# Patient Record
Sex: Female | Born: 1975 | Race: White | Hispanic: No | Marital: Married | Smoking: Never smoker
Health system: Southern US, Community
[De-identification: ages and names within clinical notes are randomized; demographics above are authoritative.]

## PROBLEM LIST (undated history)

## (undated) DIAGNOSIS — N970 Female infertility associated with anovulation: Secondary | ICD-10-CM

## (undated) DIAGNOSIS — N841 Polyp of cervix uteri: Secondary | ICD-10-CM

## (undated) DIAGNOSIS — A609 Anogenital herpesviral infection, unspecified: Secondary | ICD-10-CM

## (undated) HISTORY — DX: Polyp of cervix uteri: N84.1

## (undated) HISTORY — DX: Female infertility associated with anovulation: N97.0

## (undated) HISTORY — DX: Anogenital herpesviral infection, unspecified: A60.9

---

## 2012-07-01 ENCOUNTER — Encounter: Payer: Self-pay | Admitting: Family Medicine

## 2012-07-01 ENCOUNTER — Ambulatory Visit (INDEPENDENT_AMBULATORY_CARE_PROVIDER_SITE_OTHER): Payer: BC Managed Care – PPO | Admitting: Family Medicine

## 2012-07-01 VITALS — BP 125/79 | HR 78 | Temp 97.1°F | Resp 16 | Ht 66.5 in | Wt 197.0 lb

## 2012-07-01 DIAGNOSIS — A6000 Herpesviral infection of urogenital system, unspecified: Secondary | ICD-10-CM

## 2012-07-01 DIAGNOSIS — A6009 Herpesviral infection of other urogenital tract: Secondary | ICD-10-CM | POA: Insufficient documentation

## 2012-07-01 MED ORDER — VALACYCLOVIR HCL 1 G PO TABS: 1000.0000 mg | ORAL_TABLET | Freq: Every day | ORAL | Status: DC

## 2012-07-01 NOTE — Progress Notes (Signed)
Subjective:    Patient ID: Tanya Alvarez, female    DOB: [birth year], 37 y.o.   MRN: [id number] Chief Complaint  Patient presents with  . consultation    may need refill on valtrex   HPI  Tanya Alvarez is a delightful 37 yo woman who is here to establish care. She and her husband moved here sev mos ago from [place], [place]. She and her husband have been in a monogamous relationship for many years. However, last June she developed some painful genital ulcerations and was diagnosed with Type 1 and 2 HSV by + clx but negative titers so she knew it was a new infection. Her husband never really had symptoms but he was tested an did have positive titers so he had had HSV for a long time.  Husband was put on supressive therapy but she was just put on abortive therapy. She has now had 4 breakouts in past 7 mos so would like to start on suppressive therapy if I agree that it is medically indicated. Was seeing a fertility specialist in [place] before she moved as her and her husband have been unsuccessfully trying to conceive for yrs.  She did have an appt with some repro edno clinic ([facility] in [place] but was very unimpressed - did not seem like they had the lastest techniques and would be VERY expensive - the dr.s in [place] had found ways to make it more financially feasible. For now she is going to make an appt w/ a different repro endo dr - [facility]? - but if that doesn't work she will consider going to one of the teaching hospitals vs traveling back to [place] for her care.  No past medical history on file. No current outpatient prescriptions on file prior to visit.   Not on File No past surgical history on file. Family History  Problem Relation Age of Onset  . Cancer Maternal Grandmother     lung  . Cancer Maternal Grandfather     lung  . Cancer Paternal Grandmother     prostate   History  Substance Use Topics  . Smoking status: Never Smoker   . Smokeless tobacco:  Not on file  . Alcohol Use: No     Review of Systems  Constitutional: Negative for fever, chills and unexpected weight change.  Genitourinary: Positive for genital sores and menstrual problem. Negative for dysuria, hematuria, flank pain, vaginal discharge, pelvic pain and dyspareunia.  Psychiatric/Behavioral: Negative for dysphoric mood. The patient is not nervous/anxious.       BP 125/79  Pulse 78  Temp 97.1 F (36.2 C) (Oral)  Resp 16  Ht 5' 6.5" (1.689 m)  Wt 197 lb (89.359 kg)  BMI 31.32 kg/m2  LMP 06/23/2012 Objective:   Physical Exam  Constitutional: She is oriented to person, place, and time. She appears well-developed and well-nourished. No distress.  HENT:  Head: Normocephalic and atraumatic.  Right Ear: External ear normal.  Left Ear: External ear normal.  Eyes: Conjunctivae normal are normal. No scleral icterus.  Neck: Normal range of motion. Neck supple. No thyromegaly present.  Cardiovascular: Normal rate, regular rhythm, normal heart sounds and intact distal pulses.   Pulmonary/Chest: Effort normal and breath sounds normal. No respiratory distress.  Musculoskeletal: She exhibits no edema.  Lymphadenopathy:    She has no cervical adenopathy.  Neurological: She is alert and oriented to person, place, and time.  Skin: Skin is warm and dry. She is not diaphoretic. No erythema.  Psychiatric: She has a normal mood and affect. Her behavior is normal.          Assessment & Plan:   1. Genital herpes in women  valACYclovir (VALTREX) 1000 MG tablet  Start daily suppressive therapy - likely ok to use in pregnancy but may want to stop during the firs trimester. She will discuss w/ her future gynecologist when the time comes. Cont PNV. Meds ordered this encounter  Medications         . valACYclovir (VALTREX) 1000 MG tablet    Sig: Take 1 tablet (1,000 mg total) by mouth daily.    Dispense:  90 tablet    Refill:  3

## 2012-07-04 ENCOUNTER — Encounter: Payer: Self-pay | Admitting: *Deleted

## 2013-06-08 NOTE — L&D Delivery Note (Signed)
Delivery Note At 6:39 PM a healthy female was delivered via Vaginal, Spontaneous Delivery (Presentation: ant occiput  ).  1 loose [place].  APGAR: 8, 9; weight .   Placenta status: Intact, Spontaneous.  Cord: 3 vessels with the following complications: None.  Cord pH: Not done  Anesthesia: Epidural  Episiotomy: None Lacerations: 1st degree;Labial Suture Repair: 3.0 vicryl rapide Est. Blood Loss (mL): 400  Mom to postpartum.  Baby to Couplet care / Skin to Skin.  Evyn Kooyman,MARIE-LYNE 02/01/2014, 7:17 PM

## 2013-06-23 LAB — OB RESULTS CONSOLE HEPATITIS B SURFACE ANTIGEN: Hepatitis B Surface Ag: NEGATIVE

## 2013-06-23 LAB — OB RESULTS CONSOLE GC/CHLAMYDIA
CHLAMYDIA, DNA PROBE: NEGATIVE
GC PROBE AMP, GENITAL: NEGATIVE

## 2013-06-23 LAB — OB RESULTS CONSOLE HIV ANTIBODY (ROUTINE TESTING): HIV: NONREACTIVE

## 2013-06-23 LAB — OB RESULTS CONSOLE ABO/RH: RH Type: POSITIVE

## 2013-06-23 LAB — OB RESULTS CONSOLE RPR: RPR: NONREACTIVE

## 2013-06-23 LAB — OB RESULTS CONSOLE ANTIBODY SCREEN: ANTIBODY SCREEN: NEGATIVE

## 2013-06-23 LAB — OB RESULTS CONSOLE RUBELLA ANTIBODY, IGM: Rubella: IMMUNE

## 2013-08-29 ENCOUNTER — Ambulatory Visit (HOSPITAL_COMMUNITY)
Admission: RE | Admit: 2013-08-29 | Discharge: 2013-08-29 | Disposition: A | Discharge status: Home / Self Care | Payer: BC Managed Care – PPO | Source: Ambulatory Visit | Attending: Certified Nurse Midwife | Admitting: Certified Nurse Midwife

## 2013-08-29 ENCOUNTER — Other Ambulatory Visit (HOSPITAL_COMMUNITY): Payer: Self-pay | Admitting: Obstetrics & Gynecology

## 2013-08-29 DIAGNOSIS — M79609 Pain in unspecified limb: Secondary | ICD-10-CM | POA: Insufficient documentation

## 2013-08-29 DIAGNOSIS — M25569 Pain in unspecified knee: Secondary | ICD-10-CM

## 2013-08-29 DIAGNOSIS — M7989 Other specified soft tissue disorders: Secondary | ICD-10-CM | POA: Insufficient documentation

## 2013-08-29 DIAGNOSIS — M25469 Effusion, unspecified knee: Secondary | ICD-10-CM

## 2013-08-29 NOTE — Progress Notes (Signed)
VASCULAR LAB PRELIMINARY  PRELIMINARY  PRELIMINARY  PRELIMINARY  Right lower extremity venous duplex completed.    Preliminary report:  Right:  No evidence of DVT, superficial thrombosis, or Baker's cyst.  Keierra Nudo, RVT 08/29/2013, 6:00 PM

## 2014-01-12 LAB — OB RESULTS CONSOLE GBS: GBS: NEGATIVE

## 2014-01-29 ENCOUNTER — Encounter (HOSPITAL_COMMUNITY): Payer: Self-pay | Admitting: *Deleted

## 2014-01-29 ENCOUNTER — Telehealth (HOSPITAL_COMMUNITY): Payer: Self-pay | Admitting: *Deleted

## 2014-01-29 NOTE — Telephone Encounter (Signed)
Preadmission screen  

## 2014-01-30 ENCOUNTER — Other Ambulatory Visit: Payer: Self-pay | Admitting: Obstetrics & Gynecology

## 2014-02-01 ENCOUNTER — Inpatient Hospital Stay (HOSPITAL_COMMUNITY): Payer: BC Managed Care – PPO | Admitting: Anesthesiology

## 2014-02-01 ENCOUNTER — Inpatient Hospital Stay (HOSPITAL_COMMUNITY)
Admission: RE | Admit: 2014-02-01 | Discharge: 2014-02-01 | Disposition: A | Discharge status: Home / Self Care | Payer: BC Managed Care – PPO | Source: Ambulatory Visit | Attending: Obstetrics & Gynecology | Admitting: Obstetrics & Gynecology

## 2014-02-01 ENCOUNTER — Encounter (HOSPITAL_COMMUNITY): Payer: Self-pay | Admitting: *Deleted

## 2014-02-01 ENCOUNTER — Encounter (HOSPITAL_COMMUNITY): Payer: BC Managed Care – PPO | Admitting: Anesthesiology

## 2014-02-01 ENCOUNTER — Inpatient Hospital Stay (HOSPITAL_COMMUNITY)
Admission: AD | Admit: 2014-02-01 | Discharge: 2014-02-03 | DRG: 774 | Disposition: A | Discharge status: Home / Self Care | Payer: BC Managed Care – PPO | Source: Ambulatory Visit | Attending: Obstetrics & Gynecology | Admitting: Obstetrics & Gynecology

## 2014-02-01 DIAGNOSIS — O409XX0 Polyhydramnios, unspecified trimester, not applicable or unspecified: Principal | ICD-10-CM | POA: Diagnosis present

## 2014-02-01 DIAGNOSIS — D62 Acute posthemorrhagic anemia: Secondary | ICD-10-CM | POA: Diagnosis not present

## 2014-02-01 DIAGNOSIS — A6000 Herpesviral infection of urogenital system, unspecified: Secondary | ICD-10-CM | POA: Diagnosis present

## 2014-02-01 DIAGNOSIS — O98519 Other viral diseases complicating pregnancy, unspecified trimester: Secondary | ICD-10-CM | POA: Diagnosis present

## 2014-02-01 DIAGNOSIS — O480 Post-term pregnancy: Secondary | ICD-10-CM | POA: Diagnosis present

## 2014-02-01 DIAGNOSIS — O9903 Anemia complicating the puerperium: Secondary | ICD-10-CM | POA: Diagnosis not present

## 2014-02-01 DIAGNOSIS — Z6841 Body Mass Index (BMI) 40.0 and over, adult: Secondary | ICD-10-CM

## 2014-02-01 DIAGNOSIS — E669 Obesity, unspecified: Secondary | ICD-10-CM | POA: Diagnosis present

## 2014-02-01 DIAGNOSIS — O99214 Obesity complicating childbirth: Secondary | ICD-10-CM

## 2014-02-01 LAB — CBC
HCT: 36.7 % (ref 36.0–46.0)
HEMOGLOBIN: 12.7 g/dL (ref 12.0–15.0)
MCH: 31.8 pg (ref 26.0–34.0)
MCHC: 34.6 g/dL (ref 30.0–36.0)
MCV: 91.8 fL (ref 78.0–100.0)
PLATELETS: 207 10*3/uL (ref 150–400)
RBC: 4 MIL/uL (ref 3.87–5.11)
RDW: 13.1 % (ref 11.5–15.5)
WBC: 10.8 10*3/uL — ABNORMAL HIGH (ref 4.0–10.5)

## 2014-02-01 LAB — RPR

## 2014-02-01 LAB — ABO/RH: ABO/RH(D): A POS

## 2014-02-01 LAB — TYPE AND SCREEN
ABO/RH(D): A POS
Antibody Screen: NEGATIVE

## 2014-02-01 MED ORDER — OXYTOCIN 40 UNITS IN LACTATED RINGERS INFUSION - SIMPLE MED: 62.5000 mL/h | INTRAVENOUS | Status: DC | PRN

## 2014-02-01 MED ORDER — ONDANSETRON HCL 4 MG/2ML IJ SOLN: 4.0000 mg | Freq: Four times a day (QID) | INTRAMUSCULAR | Status: DC | PRN

## 2014-02-01 MED ORDER — OXYCODONE-ACETAMINOPHEN 5-325 MG PO TABS: 1.0000 | ORAL_TABLET | ORAL | Status: DC | PRN

## 2014-02-01 MED ORDER — ACETAMINOPHEN 325 MG PO TABS: 650.0000 mg | ORAL_TABLET | ORAL | Status: DC | PRN

## 2014-02-01 MED ORDER — EPHEDRINE 5 MG/ML INJ
10.0000 mg | INTRAVENOUS | Status: DC | PRN
  Filled 2014-02-01: qty 2

## 2014-02-01 MED ORDER — WITCH HAZEL-GLYCERIN EX PADS: 1.0000 "application " | MEDICATED_PAD | CUTANEOUS | Status: DC | PRN

## 2014-02-01 MED ORDER — FLEET ENEMA 7-19 GM/118ML RE ENEM
1.0000 | ENEMA | RECTAL | Status: DC | PRN
Start: 2014-02-01 — End: 2014-02-01

## 2014-02-01 MED ORDER — LACTATED RINGERS IV SOLN
INTRAVENOUS | Status: DC
  Administered 2014-02-01 (×3): via INTRAVENOUS

## 2014-02-01 MED ORDER — CITRIC ACID-SODIUM CITRATE 334-500 MG/5ML PO SOLN
30.0000 mL | ORAL | Status: DC | PRN
Start: 2014-02-01 — End: 2014-02-01

## 2014-02-01 MED ORDER — OXYTOCIN BOLUS FROM INFUSION: 500.0000 mL | INTRAVENOUS | Status: DC

## 2014-02-01 MED ORDER — TETANUS-DIPHTH-ACELL PERTUSSIS 5-2.5-18.5 LF-MCG/0.5 IM SUSP: 0.5000 mL | Freq: Once | INTRAMUSCULAR | Status: DC

## 2014-02-01 MED ORDER — FENTANYL 2.5 MCG/ML BUPIVACAINE 1/10 % EPIDURAL INFUSION (WH - ANES)
14.0000 mL/h | INTRAMUSCULAR | Status: DC | PRN
  Administered 2014-02-01: 14 mL/h via EPIDURAL
  Filled 2014-02-01: qty 125

## 2014-02-01 MED ORDER — PRENATAL MULTIVITAMIN CH
1.0000 | ORAL_TABLET | Freq: Every day | ORAL | Status: DC
  Administered 2014-02-02 – 2014-02-03 (×2): 1 via ORAL
  Filled 2014-02-01 (×2): qty 1

## 2014-02-01 MED ORDER — BUPIVACAINE HCL (PF) 0.25 % IJ SOLN
INTRAMUSCULAR | Status: DC | PRN
  Administered 2014-02-01 (×2): 5 mL via EPIDURAL

## 2014-02-01 MED ORDER — DIPHENHYDRAMINE HCL 50 MG/ML IJ SOLN: 12.5000 mg | INTRAMUSCULAR | Status: DC | PRN

## 2014-02-01 MED ORDER — LACTATED RINGERS IV SOLN: 500.0000 mL | Freq: Once | INTRAVENOUS | Status: DC

## 2014-02-01 MED ORDER — ONDANSETRON HCL 4 MG PO TABS: 4.0000 mg | ORAL_TABLET | ORAL | Status: DC | PRN

## 2014-02-01 MED ORDER — OXYTOCIN 40 UNITS IN LACTATED RINGERS INFUSION - SIMPLE MED: 62.5000 mL/h | INTRAVENOUS | Status: DC

## 2014-02-01 MED ORDER — ZOLPIDEM TARTRATE 5 MG PO TABS: 5.0000 mg | ORAL_TABLET | Freq: Every evening | ORAL | Status: DC | PRN

## 2014-02-01 MED ORDER — PHENYLEPHRINE 40 MCG/ML (10ML) SYRINGE FOR IV PUSH (FOR BLOOD PRESSURE SUPPORT)
80.0000 ug | PREFILLED_SYRINGE | INTRAVENOUS | Status: DC | PRN
  Filled 2014-02-01: qty 2

## 2014-02-01 MED ORDER — DIPHENHYDRAMINE HCL 25 MG PO CAPS: 25.0000 mg | ORAL_CAPSULE | Freq: Four times a day (QID) | ORAL | Status: DC | PRN

## 2014-02-01 MED ORDER — DIBUCAINE 1 % RE OINT: 1.0000 "application " | TOPICAL_OINTMENT | RECTAL | Status: DC | PRN

## 2014-02-01 MED ORDER — OXYTOCIN 40 UNITS IN LACTATED RINGERS INFUSION - SIMPLE MED
1.0000 m[IU]/min | INTRAVENOUS | Status: DC
  Administered 2014-02-01: 2 m[IU]/min via INTRAVENOUS
  Filled 2014-02-01: qty 1000

## 2014-02-01 MED ORDER — LACTATED RINGERS IV SOLN: 500.0000 mL | INTRAVENOUS | Status: DC | PRN

## 2014-02-01 MED ORDER — IBUPROFEN 600 MG PO TABS: 600.0000 mg | ORAL_TABLET | Freq: Four times a day (QID) | ORAL | Status: DC | PRN

## 2014-02-01 MED ORDER — BENZOCAINE-MENTHOL 20-0.5 % EX AERO
1.0000 "application " | INHALATION_SPRAY | CUTANEOUS | Status: DC | PRN
  Administered 2014-02-01: 1 via TOPICAL
  Filled 2014-02-01 (×2): qty 56

## 2014-02-01 MED ORDER — LIDOCAINE HCL (PF) 1 % IJ SOLN
INTRAMUSCULAR | Status: DC | PRN
  Administered 2014-02-01 (×7): 4 mL

## 2014-02-01 MED ORDER — IBUPROFEN 600 MG PO TABS
600.0000 mg | ORAL_TABLET | Freq: Four times a day (QID) | ORAL | Status: DC
  Administered 2014-02-01 – 2014-02-03 (×7): 600 mg via ORAL
  Filled 2014-02-01 (×7): qty 1

## 2014-02-01 MED ORDER — SIMETHICONE 80 MG PO CHEW: 80.0000 mg | CHEWABLE_TABLET | ORAL | Status: DC | PRN

## 2014-02-01 MED ORDER — LIDOCAINE HCL (PF) 1 % IJ SOLN
30.0000 mL | INTRAMUSCULAR | Status: DC | PRN
  Filled 2014-02-01: qty 30

## 2014-02-01 MED ORDER — ONDANSETRON HCL 4 MG/2ML IJ SOLN: 4.0000 mg | INTRAMUSCULAR | Status: DC | PRN

## 2014-02-01 MED ORDER — SENNOSIDES-DOCUSATE SODIUM 8.6-50 MG PO TABS
2.0000 | ORAL_TABLET | ORAL | Status: DC
  Administered 2014-02-02: 2 via ORAL
  Filled 2014-02-01: qty 2

## 2014-02-01 MED ORDER — LANOLIN HYDROUS EX OINT: TOPICAL_OINTMENT | CUTANEOUS | Status: DC | PRN

## 2014-02-01 MED ORDER — TERBUTALINE SULFATE 1 MG/ML IJ SOLN: 0.2500 mg | Freq: Once | INTRAMUSCULAR | Status: DC | PRN

## 2014-02-01 MED ORDER — PHENYLEPHRINE 40 MCG/ML (10ML) SYRINGE FOR IV PUSH (FOR BLOOD PRESSURE SUPPORT)
80.0000 ug | PREFILLED_SYRINGE | INTRAVENOUS | Status: DC | PRN
  Filled 2014-02-01: qty 2
  Filled 2014-02-01: qty 10

## 2014-02-01 NOTE — H&P (Signed)
Tanya Alvarez is a 38 y.o. female G1P0 [redacted]w[redacted]d presenting for Induction.  RA:  Induction Polyhydramnios/pos dates.  HPI:  No reg UC on admission.  FMs pos.  No AF leak. No vaginal bleeding.  No PIH Sx.  OB History   Grav Para Term Preterm Abortions TAB SAB Ect Mult Living   1         0     Past Medical History  Diagnosis Date  . Cervical polyp   . Infertility associated with anovulation   . HSV (herpes simplex virus) anogenital infection    No past surgical history on file. Family History: family history includes Cancer in her maternal grandfather, maternal grandmother, and paternal grandmother. Social History:  reports that she has never smoked. She does not have any smokeless tobacco history on file. She reports that she does not drink alcohol. Her drug history is not on file.  No Known Allergies  Dilation: 3 Effacement (%): 80 Station: -2 Exam by:: J.Cox, RN Blood pressure 116/59, pulse 86, height  (1.676 m), weight 113.853 kg (251 lb), last menstrual period 04/09/2013.  Reexamined:  VE 4/100/Vtx/-2/-1.  AROM  Meco fluidy, +++ in quantity.  Exam Physical Exam  FHR reactive, Base line 140-145/min, many good accelerations, no decelerations. UC mild, increasing on Pitocin.  HPP:  Patient Active Problem List   Diagnosis Date Noted  . Indication for care in labor or delivery 02/01/2014  . Genital herpes in women 07/01/2012    Prenatal labs: ABO, Rh: --/--/A POS (08/27 0800) Antibody: NEG (08/27 0800) Rubella:  Immune RPR: Nonreactive (01/16 0000)  HBsAg: Negative (01/16 0000)  HIV: Non-reactive (01/16 0000)  Genetic testing: Ultrascreen wnl [place] anato: wnl 1 hr GTT: wnl GBS: Negative (08/07 0000)  [place]:  EFW 76%, AFI 22+ cm On Valtrex Prophylaxis for HSV.  Assessment/Plan: 40 4/7 wks  Polyhydramnios, post dates for induction.  Fetal well-being reassuring.  Pitocin/AROM.  Tinted Meco.  Will add Amnioinfusion per FHR monitoring.  Expectant management towards  vaginal delivery.  Epidural PRN.     [place] Bula,MARIE-LYNE 02/01/2014, 11:15 AM

## 2014-02-01 NOTE — Anesthesia Procedure Notes (Addendum)
Epidural Patient location during procedure: OB Start time: 02/01/2014 1:25 PM  Staffing Anesthesiologist: Daveah Varone Performed by: anesthesiologist   Preanesthetic Checklist Completed: patient identified, site marked, surgical consent, pre-op evaluation, timeout performed, IV checked, risks and benefits discussed and monitors and equipment checked  Epidural Patient position: sitting Prep: site prepped and draped and DuraPrep Patient monitoring: continuous pulse ox and blood pressure Approach: midline Location: L3-L4 Injection technique: LOR air  Needle:  Needle type: Tuohy  Needle gauge: 17 G Needle length: 9 cm and 9 Needle insertion depth: 7.5 cm Catheter type: closed end flexible Catheter size: 19 Gauge Catheter at skin depth: 13 cm Test dose: negative  Assessment Events: blood not aspirated, injection not painful, no injection resistance, negative IV test and no paresthesia  Additional Notes Discussed risk of headache, infection, bleeding, nerve injury and failed or incomplete block.  Patient voices understanding and wishes to proceed.  Epidural placed easily on first attempt.  No paresthesia.  Patient tolerated procedure well with no apparent complications.  Jasmine December, MDReason for block:procedure for pain  Epidural Patient location during procedure: OB Start time: 02/01/2014 3:43 PM  Staffing Anesthesiologist: Orlando Devereux Performed by: anesthesiologist   Preanesthetic Checklist Completed: patient identified, site marked, surgical consent, pre-op evaluation, timeout performed, IV checked, risks and benefits discussed and monitors and equipment checked  Epidural Patient position: sitting Prep: site prepped and draped and DuraPrep Patient monitoring: continuous pulse ox and blood pressure Approach: midline Location: L3-L4 Injection technique: LOR air  Needle:  Needle type: Tuohy  Needle gauge: 17 G Needle length: 9 cm and 9 Needle insertion depth: 7  cm Catheter type: closed end flexible Catheter size: 19 Gauge Catheter at skin depth: 12 cm Test dose: negative  Assessment Events: blood not aspirated, injection not painful, no injection resistance, negative IV test and no paresthesia  Additional Notes First epidural removed - tip intact.  Epidural replaced at same level through separate puncture.  Of note, patient was comfortable throughout procedure (reported that contractions were not painful in sitting position).  Epidural placed easily on first attempt.  No paresthesia.  Patient tolerated procedure well with no apparent complications.  Jasmine December, MDReason for block:procedure for pain

## 2014-02-01 NOTE — Anesthesia Preprocedure Evaluation (Addendum)
Anesthesia Evaluation  Patient identified by MRN, date of birth, ID band Patient awake    Reviewed: Allergy & Precautions, H&P , NPO status , Patient's Chart, lab work & pertinent test results, reviewed documented beta blocker date and time   History of Anesthesia Complications Negative for: history of anesthetic complications  Airway Mallampati: III TM Distance: >3 FB Neck ROM: full    Dental  (+) Teeth Intact   Pulmonary neg pulmonary ROS,  breath sounds clear to auscultation        Cardiovascular negative cardio ROS  Rhythm:regular Rate:Normal     Neuro/Psych negative neurological ROS  negative psych ROS   GI/Hepatic negative GI ROS, Neg liver ROS,   Endo/Other  Morbid obesity  Renal/GU negative Renal ROS     Musculoskeletal   Abdominal   Peds  Hematology negative hematology ROS (+)   Anesthesia Other Findings   Reproductive/Obstetrics (+) Pregnancy                          Anesthesia Physical Anesthesia Plan  ASA: III  Anesthesia Plan: Epidural   Post-op Pain Management:    Induction:   Airway Management Planned:   Additional Equipment:   Intra-op Plan:   Post-operative Plan:   Informed Consent: I have reviewed the patients History and Physical, chart, labs and discussed the procedure including the risks, benefits and alternatives for the proposed anesthesia with the patient or authorized representative who has indicated his/her understanding and acceptance.     Plan Discussed with:   Anesthesia Plan Comments:         Anesthesia Quick Evaluation

## 2014-02-02 ENCOUNTER — Encounter (HOSPITAL_COMMUNITY): Payer: Self-pay

## 2014-02-02 LAB — CBC
HEMATOCRIT: 29.9 % — AB (ref 36.0–46.0)
HEMOGLOBIN: 10.3 g/dL — AB (ref 12.0–15.0)
MCH: 31.6 pg (ref 26.0–34.0)
MCHC: 34.4 g/dL (ref 30.0–36.0)
MCV: 91.7 fL (ref 78.0–100.0)
Platelets: 181 10*3/uL (ref 150–400)
RBC: 3.26 MIL/uL — ABNORMAL LOW (ref 3.87–5.11)
RDW: 13 % (ref 11.5–15.5)
WBC: 13.1 10*3/uL — ABNORMAL HIGH (ref 4.0–10.5)

## 2014-02-02 MED ORDER — POLYSACCHARIDE IRON COMPLEX 150 MG PO CAPS
150.0000 mg | ORAL_CAPSULE | Freq: Every day | ORAL | Status: DC
  Administered 2014-02-02 – 2014-02-03 (×2): 150 mg via ORAL
  Filled 2014-02-02 (×2): qty 1

## 2014-02-02 NOTE — Progress Notes (Signed)
Mother requested formula for infant due to infant acting fussy. Formula information sheet given and parents instructed on how to formula feed. Risks of formula given and mother encouraged to continue to place infant on breast before supplementing with formula. First formula feeding witnessed by nurse. Parents instructed to call nurse for further assistance as needed.

## 2014-02-02 NOTE — Anesthesia Postprocedure Evaluation (Signed)
  Anesthesia Post-op Note  Patient: Tanya Alvarez  Procedure(s) Performed: * No procedures listed *  Patient Location: Mother/Baby  Anesthesia Type:Epidural  Level of Consciousness: awake, alert , oriented and patient cooperative  Airway and Oxygen Therapy: Patient Spontanous Breathing  Post-op Pain: mild  Post-op Assessment: Patient's Cardiovascular Status Stable, Respiratory Function Stable, No headache, No backache, No residual numbness and No residual motor weakness  Post-op Vital Signs: stable  Last Vitals:  Filed Vitals:   02/02/14 0205  BP: 122/75  Pulse: 87  Temp: 36.8 C  Resp: 18    Complications: No apparent anesthesia complications

## 2014-02-02 NOTE — Lactation Note (Addendum)
This note was copied from the chart of Tanya Alvarez. Lactation Consultation Note Follow up visit at 23 hours of age.  Mom is crying and feels she cant breastfeed.  Baby is quiet awake in moms arms.  Mom doesn't feel baby is latching well.  Breasts are wide spaced with limited glandular tissue assessed and flat nipples.  Left breast is compressible and with "tea cup" hold baby latches well and deeply with rhythmic sucking and a few sucks audible.  Hand expression does not yield colostrum at this visit.  Mom is shaking and anxious during visit.  Encouragement provided. Due to mom not being independent with latch attempt to use a nipple shield and fitted with a #20.  Mom is able to apply nipple shield and latch with minimal assist.  DEBP set up with instructions on use and cleaning.  Encouraged to post pump 8 times in 24 hours.   Encouraged to feed with early cues on demand.  Early newborn behavior discussed.    Mom is more encouraged with feedings at this time.  Mom to call for assist as needed.      Patient Name: Tanya Alvarez [id number] Date: 02/02/2014 Reason for consult: Initial assessment   Maternal Data Has patient been taught Hand Expression?: Yes Does the patient have breastfeeding experience prior to this delivery?: No  Feeding Feeding Type: Breast Fed Length of feed: 20 min  LATCH Score/Interventions Latch: Grasps breast easily, tongue down, lips flanged, rhythmical sucking. Intervention(s): Skin to skin;Teach feeding cues;Waking techniques Intervention(s): Breast compression;Breast massage;Assist with latch;Adjust position  Audible Swallowing: None (no colostrum noted in shield unsure of transfer of  milk) Intervention(s): Skin to skin;Hand expression  Type of Nipple: Flat Intervention(s): Hand pump;Double electric pump;Shells        Hold (Positioning): Assistance needed to correctly position infant at breast and maintain latch. Intervention(s): Skin to  skin;Position options;Support Pillows;Breastfeeding basics reviewed     Lactation Tools Discussed/Used Tools: Nipple Dorris Carnes;Pump Nipple shield size: 20 Breast pump type: Double-Electric Breast Pump   Consult Status Consult Status: Follow-up Date: 02/03/14 Follow-up type: In-patient    Jannifer Rodney 02/02/2014, 6:25 PM

## 2014-02-02 NOTE — Progress Notes (Signed)
Patient ID: Tanya Alvarez, female   DOB: January 09, 1976, 38 y.o.   MRN: [id number] PPD # 1 SVD  S:  Reports feeling well             Tolerating po/ No nausea or vomiting             Bleeding is light             Pain controlled with ibuprofen (OTC)             Up ad lib / ambulatory / voiding without difficulties    Newborn  Information for the patient's newborn:  Takoda, Siedlecki [[id number]]  female  Breast feeding    O:  A & O x 3, in no apparent distress              VS:  Filed Vitals:   02/01/14 2105 02/01/14 2205 02/02/14 0205 02/02/14 0916  BP: 126/70 120/62 122/75 124/92  Pulse: 81 87 87 86  Temp: 99.5 F (37.5 C) 98.1 F (36.7 C) 98.3 F (36.8 C) 98.1 F (36.7 C)  TempSrc: Oral Oral Oral Oral  Resp: Height:      Weight:      SpO2:        LABS:  Recent Labs  02/01/14 0811 02/02/14 0605  WBC 10.8* 13.1*  HGB 12.7 10.3*  HCT 36.7 29.9*  PLT 207 181    Blood type: --/--/A POS, A POS (08/27 0800)  Rubella: Immune (01/16 0000)   I&O: I/O last 3 completed shifts: In: -  Out: 600 [Urine:200; Blood:400]             Lungs: Clear and unlabored  Heart: regular rate and rhythm / no murmurs  Abdomen: soft, non-tender, non-distended              Fundus: firm, non-tender, U-2  Perineum: 1st degree labial repair healing well  Lochia: moderate  Extremities: trace edema, no calf pain or tenderness, no Homans    A/P: PPD # 1  38 y.o., G1P1001   Principal Problem:    Postpartum care following vaginal delivery (8/27)  Mild ABL Anemia   Doing well - stable status  Start Niferex  Routine post partum orders  Anticipate discharge tomorrow    Raelyn Mora, M, MSN, CNM 02/02/2014, 10:07 AM

## 2014-02-02 NOTE — Lactation Note (Addendum)
This note was copied from the chart of Tanya Alvarez. Lactation Consultation Note New mom bf in football position. Mom states baby latches well on the Lt. Breast but not the right. Appears to have deep wide flange latch to Lt. Breast./ Rt. Nipple nipple is semi flat but compresses well if sandwhiched. Bilateral nipples has slight dimple in the middle. Gave shell to wear with bra. Encouraged to put that on as soon as finished feeding. Gave hand pump to pre-pump nipples to pull them out. Noted mom has wide space between breast and is flat chested. Has soft breast tissue that is tubular shaped. Hand expression taught, none noted. Mom denies PCOS, but stated she did have infertility issues. Mom encouraged to feed baby 8-12 times/24 hours and with feeding cues.  Educated about newborn behavior. Mom reports + breast changes w/pregnancy. Referred to Baby and Me Book in Breastfeeding section Pg. 22-23 for position options and Proper latch demonstration.[facility]/LC brochure given w/resources, support groups and LC services. Patient Name: Tanya Alvarez'T Date: 02/02/2014 Reason for consult: Initial assessment   Maternal Data   Mom encouraged to do skin-to-skin.Referred to Baby and Me Book in Breastfeeding section Pg. 22-23 for position options and Proper latch demonstration. Feeding Feeding Type: Breast Fed Length of feed: 20 min  LATCH Score/Interventions Latch: Grasps breast easily, tongue down, lips flanged, rhythmical sucking. Intervention(s): Skin to skin;Teach feeding cues;Waking techniques Intervention(s): Assist with latch;Breast massage;Breast compression  Audible Swallowing: A few with stimulation Intervention(s): Hand expression Intervention(s): Alternate breast massage;Hand expression  Type of Nipple: Flat (semi flat, compresses well) Intervention(s): Shells;Hand pump  Comfort (Breast/Nipple): Soft / non-tender     Hold (Positioning): Assistance needed to correctly  position infant at breast and maintain latch. Intervention(s): Breastfeeding basics reviewed;Support Pillows;Position options;Skin to skin  LATCH Score: 7  Lactation Tools Discussed/Used Tools: Shells;Pump Shell Type: Inverted Breast pump type: Manual Pump Review: Milk Storage;Setup, frequency, and cleaning Initiated by:: Peri Jefferson RN Date initiated:: 02/02/14   Consult Status Consult Status: Follow-up Date: 02/02/14 Follow-up type: In-patient    Chasady Longwell, Diamond Nickel 02/02/2014, 7:05 AM

## 2014-02-03 MED ORDER — IBUPROFEN 600 MG PO TABS: 600.0000 mg | ORAL_TABLET | Freq: Four times a day (QID) | ORAL | Status: AC

## 2014-02-03 MED ORDER — IBUPROFEN 600 MG PO TABS: 600.0000 mg | ORAL_TABLET | Freq: Four times a day (QID) | ORAL | Status: DC

## 2014-02-03 MED ORDER — POLYSACCHARIDE IRON COMPLEX 150 MG PO CAPS: 150.0000 mg | ORAL_CAPSULE | Freq: Every day | ORAL | Status: DC

## 2014-02-03 NOTE — Lactation Note (Signed)
This note was copied from the chart of Tanya Jeannette Maddy. Lactation Consultation Note Mom states breastfeeding was not going well. Mom states she has decided to pump and bottle feed baby, breast milk when available, and formula. Mom has a pump at home. Enc mom to call the lactation office if she has any questions or concerns.    Patient Name: Tanya Alvarez [id number] Date: 02/03/2014     Maternal Data    Feeding Feeding Type: Bottle Fed - Formula Nipple Type: Slow - flow  LATCH Score/Interventions                      Lactation Tools Discussed/Used     Consult Status      Lenard Forth 02/03/2014, 10:48 AM

## 2014-02-03 NOTE — Discharge Summary (Signed)
Obstetric Discharge Summary  Reason for Admission: induction of labor -post dates Prenatal Procedures: none Intrapartum Procedures: spontaneous vaginal delivery Postpartum Procedures: none Complications-Operative and Postpartum: 1st degree perineal laceration Hemoglobin  Date Value Ref Range Status  02/02/2014 10.3* 12.0 - 15.0 g/dL Final     DELTA CHECK NOTED     REPEATED TO VERIFY     HCT  Date Value Ref Range Status  02/02/2014 29.9* 36.0 - 46.0 % Final    Physical Exam:  General: alert, cooperative and no distress Lochia: appropriate Uterine Fundus: firm Incision: healing well DVT Evaluation: No evidence of DVT seen on physical exam.  Discharge Diagnoses: Term Pregnancy-delivered  Discharge Information: Date: 02/03/2014 Activity: pelvic rest Diet: routine Medications: PNV, Ibuprofen, Iron and magnesium OTC Condition: stable Instructions: refer to practice specific booklet Discharge to: home Follow-up Information   Follow up with LAVOIE,MARIE-LYNE, MD. Schedule an appointment as soon as possible for a visit in 6 weeks.   Specialty:  Obstetrics and Gynecology   Contact information:   [address] [place] [address] [phone]       Newborn Data: Live born female  Birth Weight: 8 lb 11.3 oz (3950 g) APGAR: 8, 9  Home with mother.  Tanya Alvarez 02/03/2014, 10:36 AM

## 2014-02-03 NOTE — Progress Notes (Signed)
PPD 2 SVD  S:  Reports feeling well - ready to go home             Tolerating po/ No nausea or vomiting             Bleeding is light             Pain controlled with motrin only             Up ad lib / ambulatory / voiding QS  Newborn fussy - difficulty with latching & frustrated no milk yet / breast & bottle(formula) feeding   O:               VS: BP 112/53  Pulse 82  Temp(Src) 98 F (36.7 C) (Oral)  Resp 18  Ht  (1.676 m)  Wt 113.853 kg (251 lb)  BMI 40.53 kg/m2  SpO2 98%  LMP 04/09/2013  Breastfeeding? Unknown   LABS:              Recent Labs  02/01/14 0811 02/02/14 0605  WBC 10.8* 13.1*  HGB 12.7 10.3*  PLT 207 181               Blood type: --/--/A POS, A POS (08/27 0800)  Rubella: Immune (01/16 0000)                     Physical Exam:             Alert and oriented X3  Lungs: Clear and unlabored  Heart: regular rate and rhythm / no mumurs  Abdomen: soft, non-tender, non-distended              Fundus: firm, non-tender, U-1  Perineum: mild edema  Lochia: light  Extremities: trace edema, no calf pain or tenderness    A: PPD # 2   Doing well - stable status  P: Routine post partum orders  DC home - WOB booklet / instructions reviewed   Reviewed course for breastfeeding at home - baby to breat every 4 hours with pumping between feedings ok - if opting to supplement with formula - do so after a breastfeeding session. If decides to formula feed only - wear bra for 1 week / avoid any breast stimulation.  Marlinda Mike CNM, MSN, [facility] 02/03/2014, 10:31 AM

## 2014-02-03 NOTE — Discharge Instructions (Signed)
Take iron daily x 4 weeks - continue prenatal for 1 year  Take OTC Magnesium supplement 200-400mg  dose daily with iron to PREVENT constipation

## 2014-04-09 ENCOUNTER — Encounter (HOSPITAL_COMMUNITY): Payer: Self-pay

## 2014-04-15 ENCOUNTER — Emergency Department (HOSPITAL_COMMUNITY)
Admission: EM | Admit: 2014-04-15 | Discharge: 2014-04-15 | Disposition: A | Discharge status: Home / Self Care | Payer: BC Managed Care – PPO | Source: Home / Self Care | Attending: Emergency Medicine | Admitting: Emergency Medicine

## 2014-04-15 ENCOUNTER — Encounter (HOSPITAL_COMMUNITY): Payer: Self-pay | Admitting: Emergency Medicine

## 2014-04-15 DIAGNOSIS — M545 Low back pain, unspecified: Secondary | ICD-10-CM

## 2014-04-15 MED ORDER — CYCLOBENZAPRINE HCL 10 MG PO TABS: 5.0000 mg | ORAL_TABLET | Freq: Three times a day (TID) | ORAL | Status: DC | PRN

## 2014-04-15 NOTE — ED Notes (Signed)
C/o back pain.  Denies injury.  Recently given birth ten weeks ago.  Hx of back spasms.  No relief with 800 mg ibuprofen.

## 2014-04-15 NOTE — Discharge Instructions (Signed)
You likely pulled a muscle in your back. Take ibuprofen 600mg  3 times a day for the next week. Take tylenol in between ibuprofen doses. Alternate ice and heat to the area 3 times a day. Take flexeril 3 times a day as needed.  This medicine will make you sleepy and loopy.  Do not take it while driving or alone with Lilah.  Follow up with your PCP if you are not improving in the next 1-2 weeks.

## 2014-04-15 NOTE — ED Provider Notes (Signed)
CSN: [id number]     Arrival date & time 04/15/14  1840 History   First MD Initiated Contact with Patient 04/15/14 1847     Chief Complaint  Patient presents with  . Back Pain   (Consider location/radiation/quality/duration/timing/severity/associated sxs/prior Treatment) HPI  She is a 38 year old woman here for evaluation of low back pain. This pain started yesterday after cleaning the cat litter box. It is located across her lower back. It does not radiate. It is worse with getting up or lying down. She states when she is up and moving it feels better. No numbness, tingling, weakness in her legs. No bowel or bladder incontinence. She has had problems in the past with muscle spasms in her back.  She has a 5010 week old baby at home. She is breast-feeding.  Past Medical History  Diagnosis Date  . Cervical polyp   . Infertility associated with anovulation   . HSV (herpes simplex virus) anogenital infection    History reviewed. No pertinent past surgical history. Family History  Problem Relation Age of Onset  . Cancer Maternal Grandmother     lung  . Cancer Maternal Grandfather     lung  . Cancer Paternal Grandmother     prostate   History  Substance Use Topics  . Smoking status: Never Smoker   . Smokeless tobacco: Not on file  . Alcohol Use: No   OB History    Gravida Para Term Preterm AB TAB SAB Ectopic Multiple Living   1 1 1       1      Review of Systems  Musculoskeletal: Positive for back pain.    Allergies  Review of patient's allergies indicates no known allergies.  Home Medications   Prior to Admission medications   Medication Sig Start Date End Date Taking? Authorizing Provider  ibuprofen (ADVIL,MOTRIN) 600 MG tablet Take 1 tablet (600 mg total) by mouth every 6 (six) hours. 02/03/14  Yes Marlinda Mikeanya Bailey, CNM  Prenatal Vit-Fe Fumarate-FA (PRENATAL MULTIVITAMIN) TABS Take 1 tablet by mouth daily.   Yes Historical Provider, MD  calcium carbonate (TUMS - DOSED IN MG  ELEMENTAL CALCIUM) 500 MG chewable tablet Chew 2-3 tablets by mouth daily as needed for indigestion or heartburn.    Historical Provider, MD  cyclobenzaprine (FLEXERIL) 10 MG tablet Take 0.5-1 tablets (5-10 mg total) by mouth 3 (three) times daily as needed for muscle spasms. 04/15/14   Charm RingsErin J Hiliary Osorto, MD  iron polysaccharides (NIFEREX) 150 MG capsule Take 1 capsule (150 mg total) by mouth daily. 02/03/14   Marlinda Mikeanya Bailey, CNM   BP 150/99 mmHg  Pulse 86  Temp(Src) 98 F (36.7 C) (Oral)  Resp 18  SpO2 97% Physical Exam  Constitutional: She is oriented to person, place, and time. She appears well-developed and well-nourished. She appears distressed (sittling a little stiffly).  Cardiovascular: Normal rate.   Pulmonary/Chest: Effort normal.  Musculoskeletal:  Back: No erythema or swelling.  No vertebral tenderness.  Diffusely tender across lower back, worse on left.  5/5 strength in bilateral lower extremities.  2+ patellar reflexes bilaterally.  Neurological: She is alert and oriented to person, place, and time.    ED Course  Procedures (including critical care time) Labs Review Labs Reviewed - No data to display  Imaging Review No results found.   MDM   1. Midline low back pain without sciatica    Symptomatic care with ice/heat, ibuprofen. Flexeril 3 times a day as needed. Follow-up with primary care provider if no  improvement in 1-2 weeks.    Charm RingsErin J Rakan Soffer, MD 04/15/14 [phone]

## 2014-07-06 ENCOUNTER — Ambulatory Visit (INDEPENDENT_AMBULATORY_CARE_PROVIDER_SITE_OTHER): Payer: BC Managed Care – PPO | Admitting: Family Medicine

## 2014-07-06 ENCOUNTER — Encounter: Payer: Self-pay | Admitting: Family Medicine

## 2014-07-06 VITALS — BP 130/80 | HR 72 | Temp 98.3°F | Resp 16 | Ht 66.0 in | Wt 225.0 lb

## 2014-07-06 DIAGNOSIS — M79604 Pain in right leg: Secondary | ICD-10-CM

## 2014-07-06 DIAGNOSIS — O99019 Anemia complicating pregnancy, unspecified trimester: Secondary | ICD-10-CM

## 2014-07-06 DIAGNOSIS — M79605 Pain in left leg: Secondary | ICD-10-CM

## 2014-07-06 DIAGNOSIS — L309 Dermatitis, unspecified: Secondary | ICD-10-CM

## 2014-07-06 LAB — COMPREHENSIVE METABOLIC PANEL
ALT: 11 U/L (ref 0–35)
AST: 19 U/L (ref 0–37)
Albumin: 4.1 g/dL (ref 3.5–5.2)
Alkaline Phosphatase: 61 U/L (ref 39–117)
BILIRUBIN TOTAL: 0.5 mg/dL (ref 0.2–1.2)
BUN: 18 mg/dL (ref 6–23)
CHLORIDE: 104 meq/L (ref 96–112)
CO2: 29 mEq/L (ref 19–32)
CREATININE: 0.74 mg/dL (ref 0.50–1.10)
Calcium: 9.6 mg/dL (ref 8.4–10.5)
GLUCOSE: 84 mg/dL (ref 70–99)
Potassium: 4.2 mEq/L (ref 3.5–5.3)
SODIUM: 140 meq/L (ref 135–145)
TOTAL PROTEIN: 7 g/dL (ref 6.0–8.3)

## 2014-07-06 LAB — CBC WITH DIFFERENTIAL/PLATELET
Basophils Absolute: 0 10*3/uL (ref 0.0–0.1)
Basophils Relative: 0 % (ref 0–1)
EOS ABS: 0.2 10*3/uL (ref 0.0–0.7)
EOS PCT: 3 % (ref 0–5)
HCT: 40.1 % (ref 36.0–46.0)
Hemoglobin: 13.6 g/dL (ref 12.0–15.0)
LYMPHS ABS: 2 10*3/uL (ref 0.7–4.0)
LYMPHS PCT: 36 % (ref 12–46)
MCH: 28.7 pg (ref 26.0–34.0)
MCHC: 33.9 g/dL (ref 30.0–36.0)
MCV: 84.6 fL (ref 78.0–100.0)
MPV: 11 fL (ref 8.6–12.4)
Monocytes Absolute: 0.5 10*3/uL (ref 0.1–1.0)
Monocytes Relative: 9 % (ref 3–12)
Neutro Abs: 2.9 10*3/uL (ref 1.7–7.7)
Neutrophils Relative %: 52 % (ref 43–77)
PLATELETS: 288 10*3/uL (ref 150–400)
RBC: 4.74 MIL/uL (ref 3.87–5.11)
RDW: 13.6 % (ref 11.5–15.5)
WBC: 5.6 10*3/uL (ref 4.0–10.5)

## 2014-07-06 LAB — MAGNESIUM: Magnesium: 2 mg/dL (ref 1.5–2.5)

## 2014-07-06 LAB — CK: Total CK: 60 U/L (ref 7–177)

## 2014-07-06 MED ORDER — TRIAMCINOLONE ACETONIDE 0.1 % EX CREA: 1.0000 "application " | TOPICAL_CREAM | Freq: Two times a day (BID) | CUTANEOUS | Status: DC

## 2014-07-06 NOTE — Progress Notes (Signed)
Subjective:    Patient ID: Tanya Alvarez, female    DOB: 02/13/1976, 39 y.o.   MRN: [id number]  HPI  This is a 39 year old female presenting with bilateral leg pain x 6 months and rash x 4 months.  She has had the bilateral leg pain ever since giving birth 6 months ago. It is located to the lateral posterior calves. She states the pain occurs 2-3 times a week, usually when she is laying in bed after a long shift at work. 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 She describes the pain as "excruciating" and like "my muscles are stretching". She denies that they feel like muscle cramps. When the pain occurs she takes ibuprofen and the pain goes away in an hour. She states during pregnancy she had LE pitting edema and underwent LE u/s to rule out DVT and negative. She denies SOB or current LE edema.  Pt is also reporting a rash x 4 months. Lesions appear and disappear within 1-2 weeks. She states the lesions start as red bumps that then coalesce into a patch. The areas are mildly pruritic and give a burning sensation. The affected areas are her anterior ankles, popliteal space, inner thighs, anterior wrists and antecubital areas. She had one lesion on her back. Her face/scalp has been spared. She has one current lesion on her left lateral wrist. The only thing she has tried is lotion which helps some. She was wondering if she could be allergic to the fitbit she got 1 month ago. She she puts in the right wrist, she does not get a rash. She reports she does have sensitive skin - she generally buys hypoallergenic products and uses dove soap. She has had changed any products recently. She reports she takes a hot bath everyday. She has never had eczema. She does have a family history of psoriasis (brother). She denies fever, chills, hair or nail changes.  Pt was anemic in pregnancy. She states she continues to feel fatigued.  Review of Systems  Constitutional:  Negative for fever and chills.  Eyes: Negative for visual disturbance.  Respiratory: Negative for shortness of breath.   Gastrointestinal: Negative for nausea, vomiting and diarrhea.  Musculoskeletal: Positive for myalgias. Negative for arthralgias.  Skin: Positive for rash.  Allergic/Immunologic: Negative for environmental allergies.  Hematological: Negative for adenopathy.    Patient Active Problem List   Diagnosis Date Noted  . Indication for care in labor or delivery 02/01/2014  . Postpartum care following vaginal delivery (8/27) 02/01/2014  . Genital herpes in women 07/01/2012   Prior to Admission medications   Medication Sig Start Date End Date Taking? Authorizing Provider  calcium carbonate (TUMS - DOSED IN MG ELEMENTAL CALCIUM) 500 MG chewable tablet Chew 2-3 tablets by mouth daily as needed for indigestion or heartburn.   Yes Historical Provider, MD  ibuprofen (ADVIL,MOTRIN) 600 MG tablet Take 1 tablet (600 mg total) by mouth every 6 (six) hours. 02/03/14  Yes Marlinda Mikeanya Bailey, CNM  Prenatal Vit-Fe Fumarate-FA (PRENATAL MULTIVITAMIN) TABS Take 1 tablet by mouth daily.   Yes Historical Provider, MD   No Known Allergies  Patient's social and family history were reviewed.     Objective:   Physical Exam  Constitutional: She is oriented to person, place, and time. She appears well-developed and well-nourished. No distress.  HENT:  Head: Normocephalic and atraumatic.  Right Ear: Hearing normal.  Left Ear: Hearing normal.  Nose: Nose normal.  Eyes: Conjunctivae and lids are normal. Right eye exhibits no discharge. Left eye exhibits no discharge. No scleral icterus.  Cardiovascular: Normal rate, regular rhythm, normal heart sounds, intact distal pulses and normal pulses.   No murmur heard. Pulmonary/Chest: Effort normal and breath sounds normal. No respiratory distress. She has no wheezes. She has no rhonchi. She has no rales.  Musculoskeletal: Normal range of motion.       Right  lower leg: Normal. She exhibits no tenderness.       Left lower leg: Normal. She exhibits no tenderness.  Bilateral LEs with varicose veins  Neurological: She is alert and oriented to person, place, and time. She has normal strength and normal reflexes. No sensory deficit. Coordination and gait normal.  Skin: Skin is warm and dry.  2x2 cm patch over left lateral wrist with scaling. Skin is generally dry.   Psychiatric: She has a normal mood and affect. Her speech is normal and behavior is normal. Thought content normal.   BP 130/80 mmHg  Pulse 72  Temp(Src) 98.3 F (36.8 C) (Oral)  Resp 16  Ht  (1.676 m)  Wt 225 lb (102.059 kg)  BMI 36.33 kg/m2  SpO2 98%  Breastfeeding? Yes     Assessment & Plan:  1. Bilateral leg pain Labs below pending. Advised that patient drink plenty of water, apply heat and massage and take magnesium before bed. - Comprehensive metabolic panel - Magnesium - TSH - Sedimentation Rate - CK  2. 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 This is less likely psoriasis as the lesions resolve so quickly. Advised pt take showers instead of baths, apply lotion after showers and apply triamcinolone cream BID to affected areas. She will return if her symptoms fail to improve. - CBC with Differential/Platelet - triamcinolone cream (KENALOG) 0.1 %; Apply 1 application topically 2 (two) times daily.  Dispense: 30 g; Refill: 0  3. Anemia in pregnancy, unspecified trimester CBC pending   [place] Miners. Dyke Brackett, MHS [facility]  07/07/2014

## 2014-07-06 NOTE — Patient Instructions (Signed)
Try taking showers instead of baths and see if that helps your dry skin. Apply a good moisturizer after your shower - eucerin, aquaphor, cetaphil are good ones. Apply triamcinolone to affected areas twice a day. We will call you with the results of your tests. Drink plenty of water. Massage the areas daily. Apply heat. Adding a magnesium supplement can help - buy buy over the counter or take tums. Return with any problems/concerns.

## 2014-07-07 LAB — SEDIMENTATION RATE: SED RATE: 8 mm/h (ref 0–22)

## 2014-07-07 LAB — TSH: TSH: 2.02 u[IU]/mL (ref 0.350–4.500)

## 2014-07-13 ENCOUNTER — Ambulatory Visit (INDEPENDENT_AMBULATORY_CARE_PROVIDER_SITE_OTHER): Payer: BC Managed Care – PPO | Admitting: Family Medicine

## 2014-07-13 ENCOUNTER — Ambulatory Visit (INDEPENDENT_AMBULATORY_CARE_PROVIDER_SITE_OTHER): Payer: BC Managed Care – PPO

## 2014-07-13 ENCOUNTER — Telehealth: Payer: Self-pay

## 2014-07-13 VITALS — BP 128/78 | HR 110 | Temp 98.5°F | Resp 16 | Ht 66.0 in | Wt 224.2 lb

## 2014-07-13 DIAGNOSIS — R1032 Left lower quadrant pain: Secondary | ICD-10-CM

## 2014-07-13 LAB — POCT URINALYSIS DIPSTICK
Bilirubin, UA: NEGATIVE
Glucose, UA: NEGATIVE
KETONES UA: NEGATIVE
NITRITE UA: NEGATIVE
Protein, UA: NEGATIVE
Spec Grav, UA: 1.015
Urobilinogen, UA: 0.2
pH, UA: 6

## 2014-07-13 LAB — POCT CBC
GRANULOCYTE PERCENT: 66.5 % (ref 37–80)
HCT, POC: 40.3 % (ref 37.7–47.9)
HEMOGLOBIN: 13.2 g/dL (ref 12.2–16.2)
LYMPH, POC: 2 (ref 0.6–3.4)
MCH: 28.9 pg (ref 27–31.2)
MCHC: 32.7 g/dL (ref 31.8–35.4)
MCV: 88.6 fL (ref 80–97)
MID (cbc): 0.5 (ref 0–0.9)
MPV: 8.9 fL (ref 0–99.8)
POC GRANULOCYTE: 4.9 (ref 2–6.9)
POC LYMPH %: 26.8 % (ref 10–50)
POC MID %: 6.7 % (ref 0–12)
Platelet Count, POC: 249 10*3/uL (ref 142–424)
RBC: 4.55 M/uL (ref 4.04–5.48)
RDW, POC: 13.7 %
WBC: 7.3 10*3/uL (ref 4.6–10.2)

## 2014-07-13 LAB — POCT UA - MICROSCOPIC ONLY
Casts, Ur, LPF, POC: NEGATIVE
Crystals, Ur, HPF, POC: NEGATIVE
MUCUS UA: NEGATIVE
Yeast, UA: NEGATIVE

## 2014-07-13 LAB — POCT URINE PREGNANCY: Preg Test, Ur: NEGATIVE

## 2014-07-13 NOTE — Telephone Encounter (Signed)
I spoke with pt, she is more worried that the pain is going through her pelvic area to her abdomen. Would you like pt to RTC? Should we Rx something else for the pain? A possible scan of the area to ease pt's mind. Please advise.

## 2014-07-13 NOTE — Telephone Encounter (Signed)
Pt was seen by Dorna LeitzNicole Bush V, PA-C at 07/06/2014 4:28 PM  For pain in her leg and abdomin. Pt states that she has been taking ibuprophion, but is still having pain. Please advise pt

## 2014-07-13 NOTE — Addendum Note (Signed)
Addended by: Isaac BlissGALLOWAY, Takyia Sindt J on: 07/13/2014 06:08 PM   Modules accepted: Orders

## 2014-07-13 NOTE — Telephone Encounter (Signed)
At last visit she was not having pain in her abdomen, only in her legs. She needs to RTC for evaluation.

## 2014-07-13 NOTE — Patient Instructions (Signed)
Drink plenty of fluids  Avoid excessive bananas and cheese  Eat lots of fruits and vegetables  Take MiraLAX twice daily for a couple of days, then once daily as needed  Return if needed  At anytime if more acute abdominal pain or fever or vomiting return or go to the emergency room

## 2014-07-13 NOTE — Telephone Encounter (Signed)
Spoke to pt- She is going to RTC

## 2014-07-13 NOTE — Progress Notes (Signed)
Subjective: 39 year old lady who is here with left lower quadrant abdominal pain since yesterday. She had a 551-month-old child that she is breast-feeding by pumping. She is on prenatal vitamins occasional Tums, and occasional ibuprofen. She took some ibuprofen with minimal relief. She has been hurting and nausea but no vomiting. Bowels active, and usually after a regular basis. She has not been having excessively hard stools. No urinary symptoms. She ate today, but didn't have much of an appetite, just has toast. No abdominal surgeries.  Objective: Pleasant lady in no major distress. Chest clear. Heart regular. Abdomen has bowel sounds present. Soft without masses. Is mildly tender in left lower quadrant. No tenderness down in the groin itself.  Assessment: Left lower quadrant abdominal pain, etiology undetermined  Plan: Abdominal x-ray, CBC, urinalysis  Results for orders placed or performed in visit on 07/13/14  POCT CBC  Result Value Ref Range   WBC 7.3 4.6 - 10.2 K/uL   Lymph, poc 2.0 0.6 - 3.4   POC LYMPH PERCENT 26.8 10 - 50 %L   MID (cbc) 0.5 0 - 0.9   POC MID % 6.7 0 - 12 %M   POC Granulocyte 4.9 2 - 6.9   Granulocyte percent 66.5 37 - 80 %G   RBC 4.55 4.04 - 5.48 M/uL   Hemoglobin 13.2 12.2 - 16.2 g/dL   HCT, POC 16.140.3 09.637.7 - 47.9 %   MCV 88.6 80 - 97 fL   MCH, POC 28.9 27 - 31.2 pg   MCHC 32.7 31.8 - 35.4 g/dL   RDW, POC 04.513.7 %   Platelet Count, POC 249 142 - 424 K/uL   MPV 8.9 0 - 99.8 fL  POCT UA - Microscopic Only  Result Value Ref Range   WBC, Ur, HPF, POC 5-7    RBC, urine, microscopic 4-6    Bacteria, U Microscopic small    Mucus, UA neg    Epithelial cells, urine per micros 5-6    Crystals, Ur, HPF, POC neg    Casts, Ur, LPF, POC neg    Yeast, UA neg    Renal tubular cells    POCT urinalysis dipstick  Result Value Ref Range   Color, UA yellow    Clarity, UA clear    Glucose, UA neg    Bilirubin, UA neg    Ketones, UA neg    Spec Grav, UA 1.015    Blood, UA small    pH, UA 6.0    Protein, UA neg    Urobilinogen, UA 0.2    Nitrite, UA neg    Leukocytes, UA small (1+)   POCT urine pregnancy  Result Value Ref Range   Preg Test, Ur Negative     [facility] reading (PRIMARY) by  Dr. Alwyn RenHopper Nonspecific gas stool  Will culture urine.  See instructions  This is most consistent with some constipation of a fairly mild degree causing left lower quadrant pain. Discussed with patient

## 2014-07-16 LAB — URINE CULTURE: Colony Count: 25000

## 2014-07-17 NOTE — Progress Notes (Signed)
Pt assessed, reviewed documentation and agree w/ assessment and plan. Eva Shaw, MD MPH   

## 2015-01-25 ENCOUNTER — Ambulatory Visit (INDEPENDENT_AMBULATORY_CARE_PROVIDER_SITE_OTHER): Payer: BC Managed Care – PPO

## 2015-01-25 ENCOUNTER — Ambulatory Visit (INDEPENDENT_AMBULATORY_CARE_PROVIDER_SITE_OTHER): Payer: BC Managed Care – PPO | Admitting: Urgent Care

## 2015-01-25 VITALS — BP 118/76 | HR 83 | Temp 98.4°F | Resp 16 | Ht 66.5 in | Wt 229.4 lb

## 2015-01-25 DIAGNOSIS — M25561 Pain in right knee: Secondary | ICD-10-CM

## 2015-01-25 DIAGNOSIS — E669 Obesity, unspecified: Secondary | ICD-10-CM | POA: Diagnosis not present

## 2015-01-25 MED ORDER — NAPROXEN SODIUM 550 MG PO TABS: 550.0000 mg | ORAL_TABLET | Freq: Two times a day (BID) | ORAL | Status: AC

## 2015-01-25 NOTE — Patient Instructions (Signed)

## 2015-01-25 NOTE — Progress Notes (Signed)
    MRN: [id number] DOB: 1975/09/27  Subjective:   Tanya Alvarez is a 39 y.o. female presenting for chief complaint of Knee pain  Reports 2 month history of right knee pain, worse with kneeling and standing for long periods of time. Her pain is achy and sharp at times, radiates to ankle occasionally. Has tried ibuprofen intermittently with some relief. Denies fever, redness, swelling, trauma, hearing popping or tearing noises, history of surgery. Patient is somewhat worried that this may be serious due to her grandfather having had bone cancer at ~44 years of age. She has had pitting edema in the past year which had started while she was pregnant. This was worked up with Dr. Clelia Alvarez with [facility], studies were negative. 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 Denies smoking cigarettes or drinking alcohol. Denies any other aggravating or relieving factors, no other questions or concerns.  Tanya Alvarez has a current medication list which includes the following prescription(s): calcium carbonate, ibuprofen, and prenatal multivitamin. Also has No Known Allergies.  Tanya Alvarez  has a past medical history of Cervical polyp; Infertility associated with anovulation; and HSV (herpes simplex virus) anogenital infection. Also  has no past surgical history on file.  Objective:   Vitals: BP 118/76 mmHg  Pulse 83  Temp(Src) 98.4 F (36.9 C) (Oral)  Resp 16  Ht 5' 6.5" (1.689 m)  Wt 229 lb 6.4 oz (104.055 kg)  BMI 36.48 kg/m2  SpO2 97%  LMP 12/27/2014  Physical Exam  Constitutional: She is oriented to person, place, and time. She appears well-developed and well-nourished.  Cardiovascular: Normal rate.   Pulmonary/Chest: Effort normal.  Musculoskeletal:       Right knee: She exhibits normal range of motion, no swelling, no effusion, no ecchymosis, no deformity, no laceration, no erythema, normal alignment and no bony tenderness. Tenderness (over area depicted)  found.       Legs: Neurological: She is alert and oriented to person, place, and time.  Skin: Skin is warm and dry. No rash noted. No erythema. No pallor.  Psychiatric: She has a normal mood and affect.   [facility] reading (PRIMARY) by  Dr. Patsy Alvarez and PA-Tanya Alvarez. Right knee - negative.  Assessment and Plan :   1. Right knee pain - Stable, likely due to patellar tendon pain secondary to overuse and obesity. Recommended Anaprox BID PRN, knee brace and weight loss. Follow up in 2 - 4 weeks if no improvement. Consider referral to PT or ortho. - DG Knee Complete 4 Views Right; Future  Tanya Bamberg, PA-C [facility] [phone] 01/25/2015 10:33 AM

## 2016-01-01 IMAGING — CR DG KNEE COMPLETE 4+V*R*
4 series · 4 of 4 positions shown · non-contrast
Comparison: None.

CLINICAL DATA: Right knee pain.

EXAM:
RIGHT KNEE - COMPLETE 4+ VIEW

[AP]
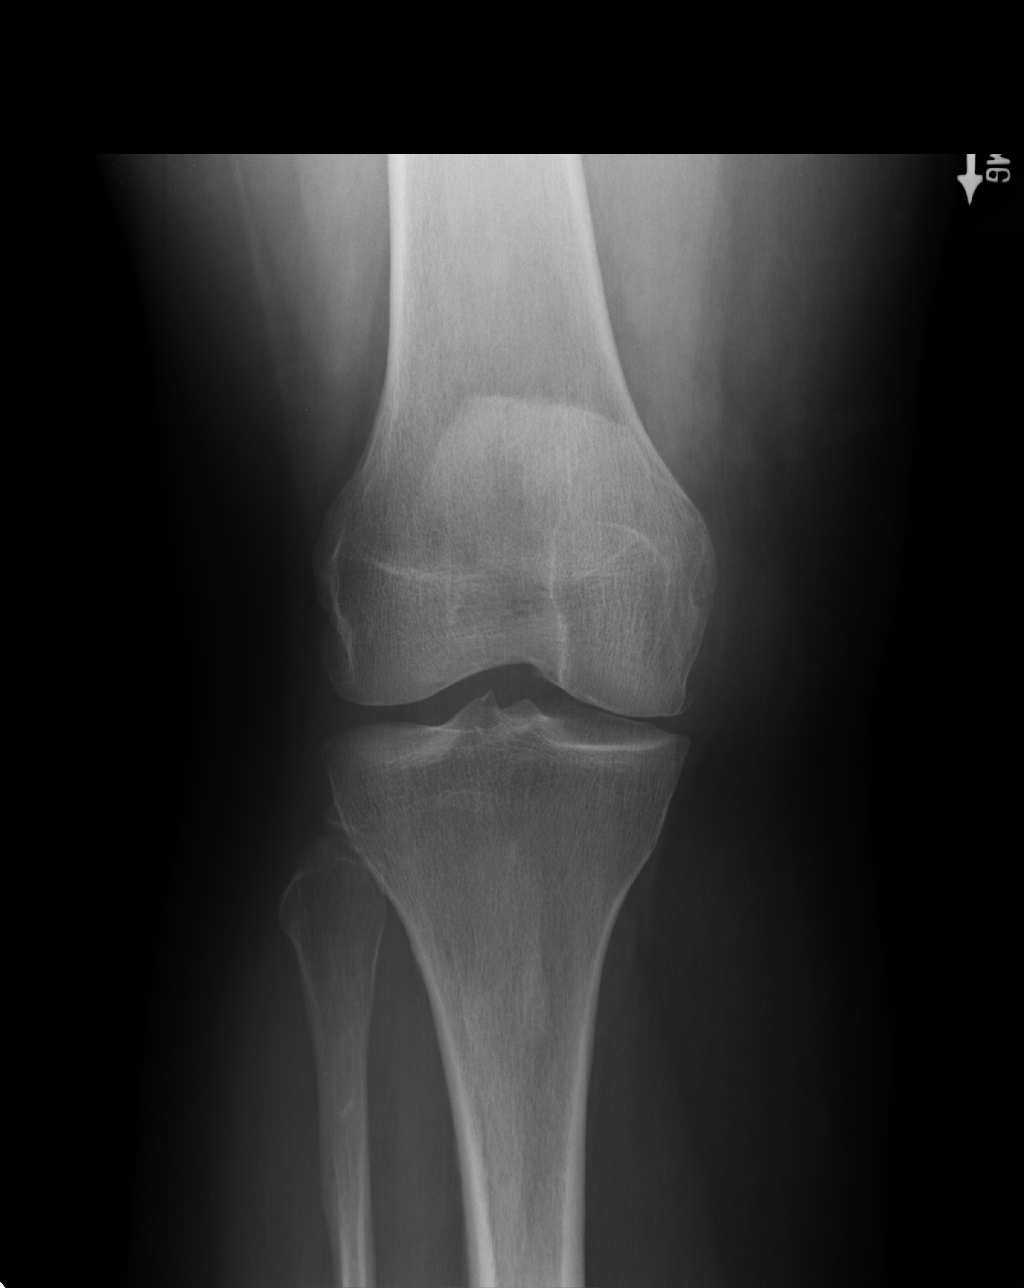

[ap axial]
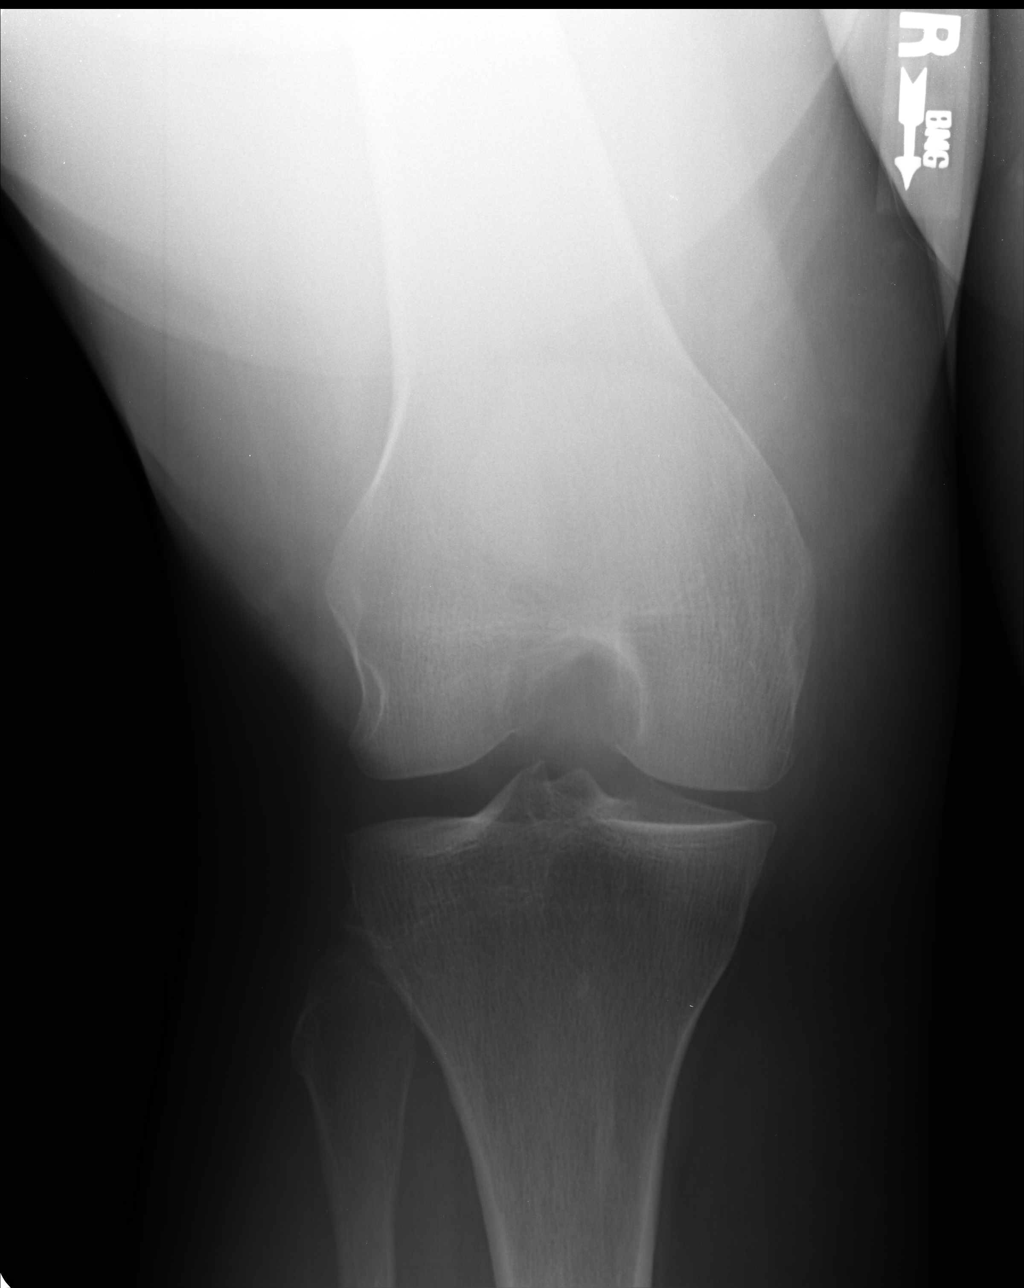

[lateral]
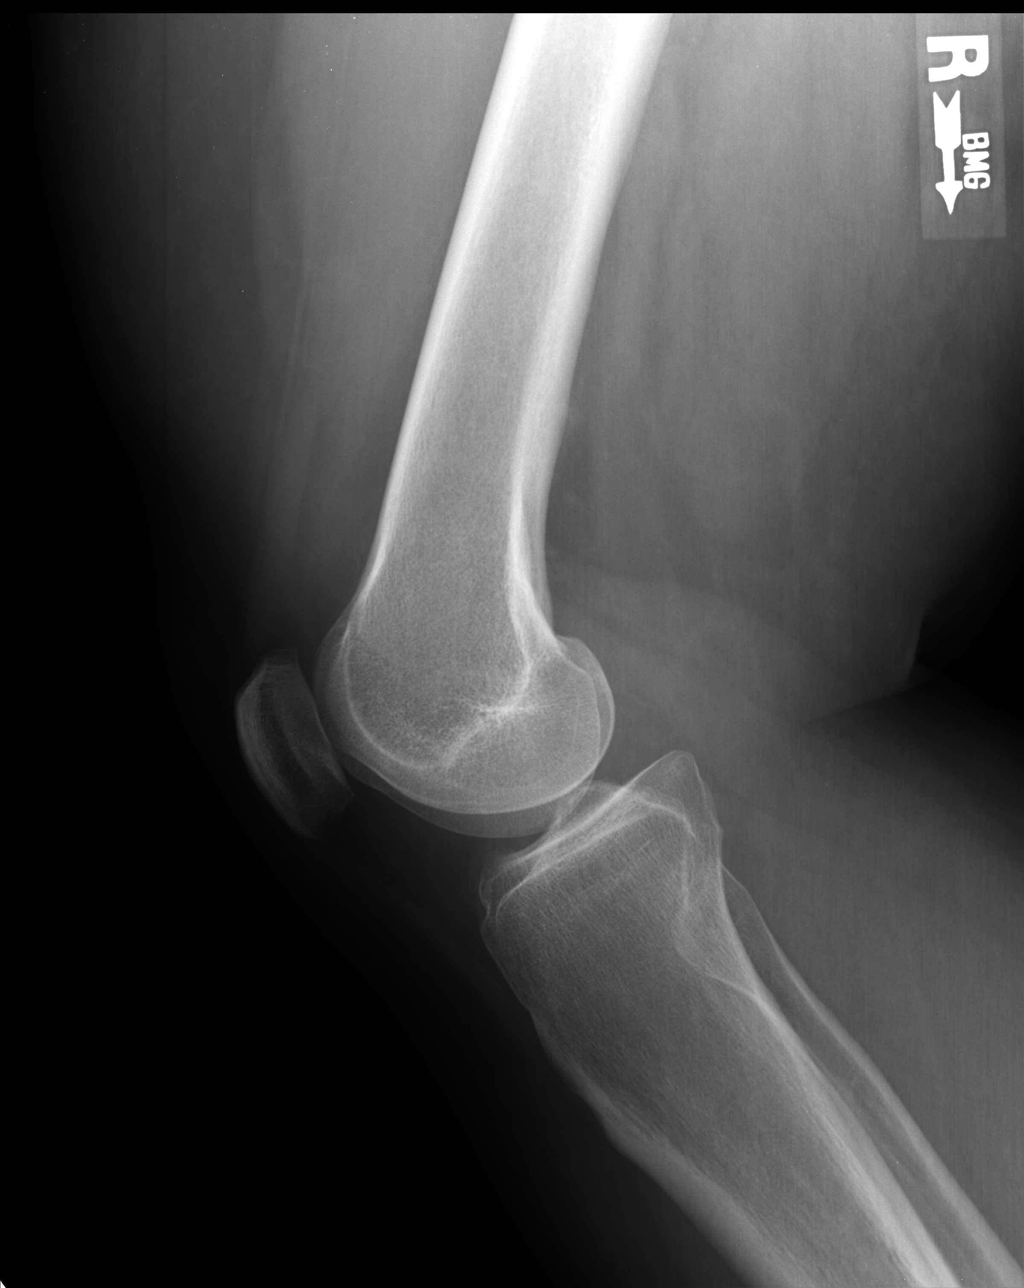

[sunrise]
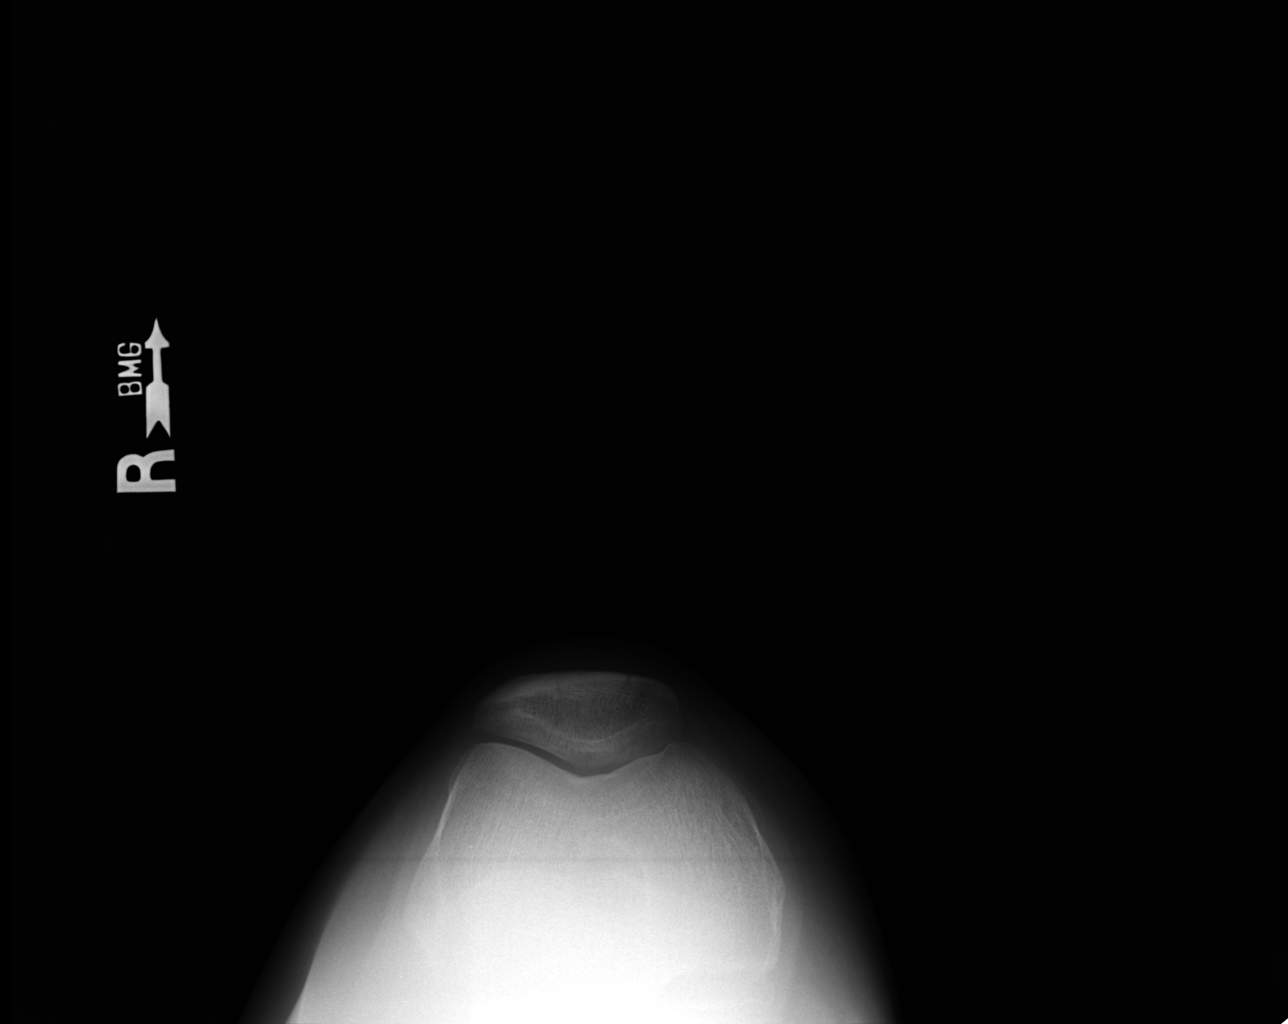

[4 of 4 positions shown; findings below may reference images not displayed]

FINDINGS: Slight narrowing of the medial compartment with a small medial
compartment marginal osteophyte in the femoral condyle. No joint
effusion. No fracture.
IMPRESSION: Slight narrowing of the medial compartment.  Otherwise, normal exam.
